# Patient Record
Sex: Male | Born: 1965 | Race: White | Hispanic: No | Marital: Married | Smoking: Never smoker
Health system: Southern US, Community
[De-identification: ages and names within clinical notes are randomized; demographics above are authoritative.]

## PROBLEM LIST (undated history)

## (undated) ENCOUNTER — Ambulatory Visit: Admission: EM | Discharge status: Absent without leave | Payer: Self-pay | Source: Home / Self Care

---

## 2020-07-23 ENCOUNTER — Other Ambulatory Visit: Payer: Self-pay

## 2020-07-23 ENCOUNTER — Ambulatory Visit
Admission: EM | Admit: 2020-07-23 | Discharge: 2020-07-23 | Disposition: A | Discharge status: Home / Self Care | Payer: BC Managed Care – PPO | Attending: Emergency Medicine | Admitting: Emergency Medicine

## 2020-07-23 ENCOUNTER — Ambulatory Visit (INDEPENDENT_AMBULATORY_CARE_PROVIDER_SITE_OTHER): Payer: BC Managed Care – PPO

## 2020-07-23 ENCOUNTER — Telehealth: Payer: Self-pay

## 2020-07-23 ENCOUNTER — Other Ambulatory Visit: Payer: Self-pay | Admitting: Infectious Diseases

## 2020-07-23 ENCOUNTER — Ambulatory Visit (HOSPITAL_COMMUNITY)
Admission: RE | Admit: 2020-07-23 | Discharge: 2020-07-23 | Disposition: A | Discharge status: Home / Self Care | Payer: BC Managed Care – PPO | Source: Ambulatory Visit | Attending: Pulmonary Disease | Admitting: Pulmonary Disease

## 2020-07-23 ENCOUNTER — Encounter: Payer: Self-pay | Admitting: Emergency Medicine

## 2020-07-23 ENCOUNTER — Telehealth: Payer: Self-pay | Admitting: Infectious Diseases

## 2020-07-23 DIAGNOSIS — U071 COVID-19: Secondary | ICD-10-CM | POA: Insufficient documentation

## 2020-07-23 DIAGNOSIS — H66003 Acute suppurative otitis media without spontaneous rupture of ear drum, bilateral: Secondary | ICD-10-CM | POA: Diagnosis present

## 2020-07-23 DIAGNOSIS — J1282 Pneumonia due to coronavirus disease 2019: Secondary | ICD-10-CM | POA: Diagnosis present

## 2020-07-23 DIAGNOSIS — R059 Cough, unspecified: Secondary | ICD-10-CM

## 2020-07-23 DIAGNOSIS — R509 Fever, unspecified: Secondary | ICD-10-CM | POA: Diagnosis not present

## 2020-07-23 LAB — RESP PANEL BY RT-PCR (FLU A&B, COVID) ARPGX2
Influenza A by PCR: NEGATIVE
Influenza B by PCR: NEGATIVE
SARS Coronavirus 2 by RT PCR: POSITIVE — AB

## 2020-07-23 MED ORDER — EPINEPHRINE 0.3 MG/0.3ML IJ SOAJ: 0.3000 mg | Freq: Once | INTRAMUSCULAR | Status: DC | PRN

## 2020-07-23 MED ORDER — AMOXICILLIN-POT CLAVULANATE 875-125 MG PO TABS: 1.0000 | ORAL_TABLET | Freq: Two times a day (BID) | ORAL | 0 refills | Status: DC

## 2020-07-23 MED ORDER — AZITHROMYCIN 250 MG PO TABS: 500.0000 mg | ORAL_TABLET | Freq: Every day | ORAL | 0 refills | Status: AC

## 2020-07-23 MED ORDER — SODIUM CHLORIDE 0.9 % IV SOLN: INTRAVENOUS | Status: DC | PRN

## 2020-07-23 MED ORDER — AZITHROMYCIN 250 MG PO TABS: 500.0000 mg | ORAL_TABLET | Freq: Every day | ORAL | 0 refills | Status: DC

## 2020-07-23 MED ORDER — ACETAMINOPHEN 325 MG PO TABS
650.0000 mg | ORAL_TABLET | Freq: Once | ORAL | Status: AC
  Administered 2020-07-23: 650 mg via ORAL
  Filled 2020-07-23: qty 2

## 2020-07-23 MED ORDER — DIPHENHYDRAMINE HCL 50 MG/ML IJ SOLN: 50.0000 mg | Freq: Once | INTRAMUSCULAR | Status: DC | PRN

## 2020-07-23 MED ORDER — METHYLPREDNISOLONE SODIUM SUCC 125 MG IJ SOLR: 125.0000 mg | Freq: Once | INTRAMUSCULAR | Status: DC | PRN

## 2020-07-23 MED ORDER — ALBUTEROL SULFATE HFA 108 (90 BASE) MCG/ACT IN AERS: 2.0000 | INHALATION_SPRAY | Freq: Once | RESPIRATORY_TRACT | Status: DC | PRN

## 2020-07-23 MED ORDER — AMOXICILLIN-POT CLAVULANATE 875-125 MG PO TABS: 1.0000 | ORAL_TABLET | Freq: Two times a day (BID) | ORAL | 0 refills | Status: AC

## 2020-07-23 MED ORDER — FAMOTIDINE IN NACL 20-0.9 MG/50ML-% IV SOLN: 20.0000 mg | Freq: Once | INTRAVENOUS | Status: DC | PRN

## 2020-07-23 MED ORDER — SOTROVIMAB 500 MG/8ML IV SOLN
500.0000 mg | Freq: Once | INTRAVENOUS | Status: AC
  Administered 2020-07-23: 500 mg via INTRAVENOUS

## 2020-07-23 NOTE — ED Provider Notes (Signed)
[facility]    CSN: [id number] Arrival date & time: 07/23/20  1044      History   Chief Complaint Chief Complaint  Patient presents with  . Cough  . Fatigue  . Generalized Body Aches    HPI Luis Merritt is a 54 y.o. male.   HPI   49-year-old male here for evaluation of cough, body aches, fatigue.  Patient reports that 2 weeks ago he was exposed to a body who stayed with him for couple days but then tested positive for Covid.  Since then he has developed the above symptoms as well as a loss of his sense of taste and smell, fever, ear pain, diarrhea, shortness of breath, and a productive cough.  Patient also had a decreased appetite.  Patient denies nausea or vomiting or sore throat.   History reviewed. No pertinent past medical history.  There are no problems to display for this patient.   History reviewed. No pertinent surgical history.     Home Medications    Prior to Admission medications   Medication Sig Start Date End Date Taking? Authorizing Provider  amoxicillin-clavulanate (AUGMENTIN) 875-125 MG tablet Take 1 tablet by mouth every 12 (twelve) hours for 10 days. 07/23/20 08/02/20  Becky Augusta, NP  azithromycin (ZITHROMAX) 250 MG tablet Take 2 tablets (500 mg total) by mouth daily for 5 days. Take first 2 tablets together, then 1 every day until finished. 07/23/20 07/28/20  Becky Augusta, NP    Family History History reviewed. No pertinent family history.  Social History Social History   Tobacco Use  . Smoking status: Never Smoker  . Smokeless tobacco: Never Used  Vaping Use  . Vaping Use: Never used  Substance Use Topics  . Alcohol use: Not Currently  . Drug use: Never     Allergies   Patient has no known allergies.   Review of Systems Review of Systems  Constitutional: Positive for appetite change, chills, fatigue and fever. Negative for activity change.  HENT: Positive for congestion, ear pain, rhinorrhea and sinus pressure.  Negative for sore throat.   Respiratory: Positive for cough and shortness of breath. Negative for wheezing.   Cardiovascular: Negative for chest pain.  Gastrointestinal: Positive for diarrhea. Negative for abdominal pain, nausea and vomiting.  Musculoskeletal: Positive for arthralgias and myalgias.  Skin: Negative for rash.  Neurological: Negative for headaches.  Hematological: Negative.   Psychiatric/Behavioral: Negative.      Physical Exam Triage Vital Signs ED Triage Vitals  Enc Vitals Group     BP 07/23/20 1057 122/77     Pulse Rate 07/23/20 1057 72     Resp 07/23/20 1057 16     Temp 07/23/20 1057 99.8 F (37.7 C)     Temp Source 07/23/20 1057 Oral     SpO2 07/23/20 1057 100 %     Weight 07/23/20 1053 250 lb (113.4 kg)     Height 07/23/20 1053 6\' 2"  (1.88 m)     Head Circumference --      Peak Flow --      Pain Score 07/23/20 1053 7     Pain Loc --      Pain Edu? --      Excl. in GC? --    No data found.  Updated Vital Signs BP 122/77 (BP Location: Right Arm)   Pulse 72   Temp 99.8 F (37.7 C) (Oral)   Resp 16   Ht 6\' 2"  (1.88 m)   Wt 250 lb (  113.4 kg)   SpO2 100%   BMI 32.10 kg/m   Visual Acuity Right Eye Distance:   Left Eye Distance:   Bilateral Distance:    Right Eye Near:   Left Eye Near:    Bilateral Near:     Physical Exam Vitals and nursing note reviewed.  Constitutional:      General: He is not in acute distress.    Appearance: Normal appearance. He is not toxic-appearing.  HENT:     Head: Normocephalic and atraumatic.     Right Ear: Ear canal and external ear normal.     Left Ear: External ear normal.     Ears:     Comments: Lateral tympanic membranes are erythematous and injected.  There is a loss of landmarks.  No effusion noted.    Nose: Rhinorrhea present. No congestion.     Comments: Nasal mucosa is mildly erythematous without edema.  There is a mixture of yellow and bloody discharge on the walls of the septum bilaterally.  Patient  has no tenderness to his sinuses to percussion.    Mouth/Throat:     Mouth: Mucous membranes are moist.     Pharynx: Oropharynx is clear. Posterior oropharyngeal erythema present. No oropharyngeal exudate.     Comments: Patient is some clear postnasal drip and posterior oropharyngeal erythema with injection. Eyes:     General: No scleral icterus.    Extraocular Movements: Extraocular movements intact.     Conjunctiva/sclera: Conjunctivae normal.     Pupils: Pupils are equal, round, and reactive to light.  Cardiovascular:     Rate and Rhythm: Normal rate and regular rhythm.     Pulses: Normal pulses.     Heart sounds: Normal heart sounds. No murmur heard.  No gallop.   Pulmonary:     Effort: Pulmonary effort is normal. No respiratory distress.     Comments: Patient has decreased lung sounds in all fields. Musculoskeletal:        General: No swelling or tenderness. Normal range of motion.     Cervical back: Normal range of motion and neck supple.  Lymphadenopathy:     Cervical: No cervical adenopathy.  Skin:    General: Skin is warm and dry.     Capillary Refill: Capillary refill takes less than 2 seconds.     Findings: No erythema or rash.  Neurological:     General: No focal deficit present.     Mental Status: He is alert and oriented to person, place, and time.  Psychiatric:        Mood and Affect: Mood normal.        Behavior: Behavior normal.        Thought Content: Thought content normal.        Judgment: Judgment normal.      UC Treatments / Results  Labs (all labs ordered are listed, but only abnormal results are displayed) Labs Reviewed  RESP PANEL BY RT-PCR (FLU A&B, COVID) ARPGX2 - Abnormal; Notable for the following components:      Result Value   SARS Coronavirus 2 by RT PCR POSITIVE (*)    All other components within normal limits    EKG   Radiology DG Chest 2 View  Result Date: 07/23/2020 CLINICAL DATA:  Fever and cough.  COVID-19 exposure EXAM:  CHEST - 2 VIEW COMPARISON:  None. FINDINGS: The heart, hila, and mediastinum are unremarkable. Increased density in the region of the first costochondral junction is likely asymmetric degenerative change. Suggested  very subtle hazy opacity in the periphery of the right mid and lower lung. Streaky opacity in either the right middle lobe or lingula based on the lateral view. The lungs are otherwise clear. No pneumothorax. No other acute abnormalities. IMPRESSION: 1. Suggested subtle hazy ground-glass opacity in the periphery of the lungs bilaterally as above may represent early developing multifocal infiltrate such as COVID-19 pneumonia given the appearance. 2. Nonspecific streaky opacity in the right middle lobe or lingula based on the lateral view could represent developing infiltrate or atelectasis. Electronically Signed   By: Gerome Sam III M.D   On: 07/23/2020 12:28    Procedures Procedures (including critical care time)  Medications Ordered in UC Medications - No data to display  Initial Impression / Assessment and Plan / UC Course  I have reviewed the triage vital signs and the nursing notes.  Pertinent labs & imaging results that were available during my care of the patient were reviewed by me and considered in my medical decision making (see chart for details).   Patient have Covid symptoms that has had for the past 9 days.  Patient was exposed to Covid 2 weeks ago when a body of his stated the house for 2 days and then later tested positive for Covid.  Patient has had a fever, loss of taste and smell, chills, fatigue, and shortness of breath.  Patient tympanic membrane's are erythematous and injected bilaterally.  There is inflammation of his nasal passages with purulent discharge on the walls of the septum.  No sinus tenderness to percussion.  Lung sounds are decreased in all fields.  Patient has not been vaccinated against the flu or Covid 19.  Patient reports until yesterday he was  following the I-MASK protocol and taking ivermectin but he is since stopped.  Will check triplex as I suspect patient has Covid and also obtain chest x-ray.   Final Clinical Impressions(s) / UC Diagnoses   Final diagnoses:  COVID-19  Pneumonia due to COVID-19 virus  Non-recurrent acute suppurative otitis media of both ears without spontaneous rupture of tympanic membranes     Discharge Instructions     To the infusion clinic for a 330 appointment to begin your antibiotic infusions.  Take the Augmentin twice daily for 10 days.  Take the azithromycin 500 mg daily for 5 days.  If you develop increasing shortness of breath, especially at rest, you cannot speak a full sentence, or you need follow-up bluing of your lip she did go to the ER for evaluation.  You need to quarantine until your symptoms have improved and you have not had a fever for 24 hours without the use of Tylenol or ibuprofen.    ED Prescriptions    Medication Sig Dispense Auth. Provider   amoxicillin-clavulanate (AUGMENTIN) 875-125 MG tablet Take 1 tablet by mouth every 12 (twelve) hours for 10 days. 20 tablet Becky Augusta, NP   azithromycin (ZITHROMAX) 250 MG tablet Take 2 tablets (500 mg total) by mouth daily for 5 days. Take first 2 tablets together, then 1 every day until finished. 10 tablet Becky Augusta, NP     PDMP not reviewed this encounter.   Becky Augusta, NP 07/23/20 1319

## 2020-07-23 NOTE — Telephone Encounter (Signed)
Called to Discuss with patient about Covid symptoms and the use of the monoclonal antibody infusion for those with mild to moderate Covid symptoms and at a high risk of hospitalization.     Pt appears to qualify for this infusion due to co-morbid conditions and/or a member of an at-risk group in accordance with the FDA Emergency Use Authorization.    Sx day 9 now and COVID PNA without hypoxia indicating moderate illness. Qualifies or infusion with BMI 31.   Information including cost discussed. He has directions to get to 3:30 appt today    Rexene Alberts, MSN, NP-C [facility]  [email or web address] Pager: [phone] Office: [phone] [facility] Main Line: [phone]

## 2020-07-23 NOTE — Progress Notes (Signed)
Patient reviewed Fact Sheet for Patients, Parents, and Caregivers for Emergency Use Authorization (EUA) of Sotrovimab for the Treatment of Coronavirus. Patient also reviewed and is agreeable to the estimated cost of treatment. Patient is agreeable to proceed.   

## 2020-07-23 NOTE — Discharge Instructions (Addendum)
To the infusion clinic for a 330 appointment to begin your antibiotic infusions.  Take the Augmentin twice daily for 10 days.  Take the azithromycin 500 mg daily for 5 days.  If you develop increasing shortness of breath, especially at rest, you cannot speak a full sentence, or you need follow-up bluing of your lip she did go to the ER for evaluation.  You need to quarantine until your symptoms have improved and you have not had a fever for 24 hours without the use of Tylenol or ibuprofen.

## 2020-07-23 NOTE — Discharge Instructions (Signed)

## 2020-07-23 NOTE — ED Triage Notes (Addendum)
Patient c/o cough, congestion, fatigue, bodyaches and chills that started a week ago.  Patient denies fevers.   Patient states that he was exposed to a family member 2 weeks ago that tested positive for COVID.  Patient states that he does not want a COVID test because he knows he already has COVID.

## 2020-07-23 NOTE — Progress Notes (Addendum)
  Diagnosis: COVID-19  Physician:Dr. Delford Field  Procedure: Sotorvimab  Complications: No immediate complications noted.  Discharge: Discharged home   Luis Merritt 07/23/2020

## 2020-07-23 NOTE — Progress Notes (Signed)
I connected by phone with Luis Merritt on 07/23/2020 at 1:42 PM to discuss the potential use of a new treatment for mild to moderate COVID-19 viral infection in non-hospitalized patients.  This patient is a 54 y.o. male that meets the FDA criteria for Emergency Use Authorization of COVID monoclonal antibody casirivimab/imdevimab, bamlanivimab/eteseviamb, or sotrovimab.  Has a (+) direct SARS-CoV-2 viral test result  Has mild or moderate COVID-19   Is NOT hospitalized due to COVID-19  Is within 10 days of symptom onset  Has at least one of the high risk factor(s) for progression to severe COVID-19 and/or hospitalization as defined in EUA.  Specific high risk criteria : BMI > 25   I have spoken and communicated the following to the patient or parent/caregiver regarding COVID monoclonal antibody treatment:  1. FDA has authorized the emergency use for the treatment of mild to moderate COVID-19 in adults and pediatric patients with positive results of direct SARS-CoV-2 viral testing who are 57 years of age and older weighing at least 40 kg, and who are at high risk for progressing to severe COVID-19 and/or hospitalization.  2. The significant known and potential risks and benefits of COVID monoclonal antibody, and the extent to which such potential risks and benefits are unknown.  3. Information on available alternative treatments and the risks and benefits of those alternatives, including clinical trials.  4. Patients treated with COVID monoclonal antibody should continue to self-isolate and use infection control measures (e.g., wear mask, isolate, social distance, avoid sharing personal items, clean and disinfect "high touch" surfaces, and frequent handwashing) according to CDC guidelines.   5. The patient or parent/caregiver has the option to accept or refuse COVID monoclonal antibody treatment.  After reviewing this information with the patient, the patient has agreed to receive one of  the available covid 19 monoclonal antibodies and will be provided an appropriate fact sheet prior to infusion. Rexene Alberts, NP 07/23/2020 1:42 PM

## 2021-11-09 IMAGING — CR DG CHEST 2V
2 series · 3 of 3 positions shown · non-contrast
Comparison: None.

CLINICAL DATA: Fever and cough.  5P9SN-2R exposure

EXAM:
CHEST - 2 VIEW

[chest pa]
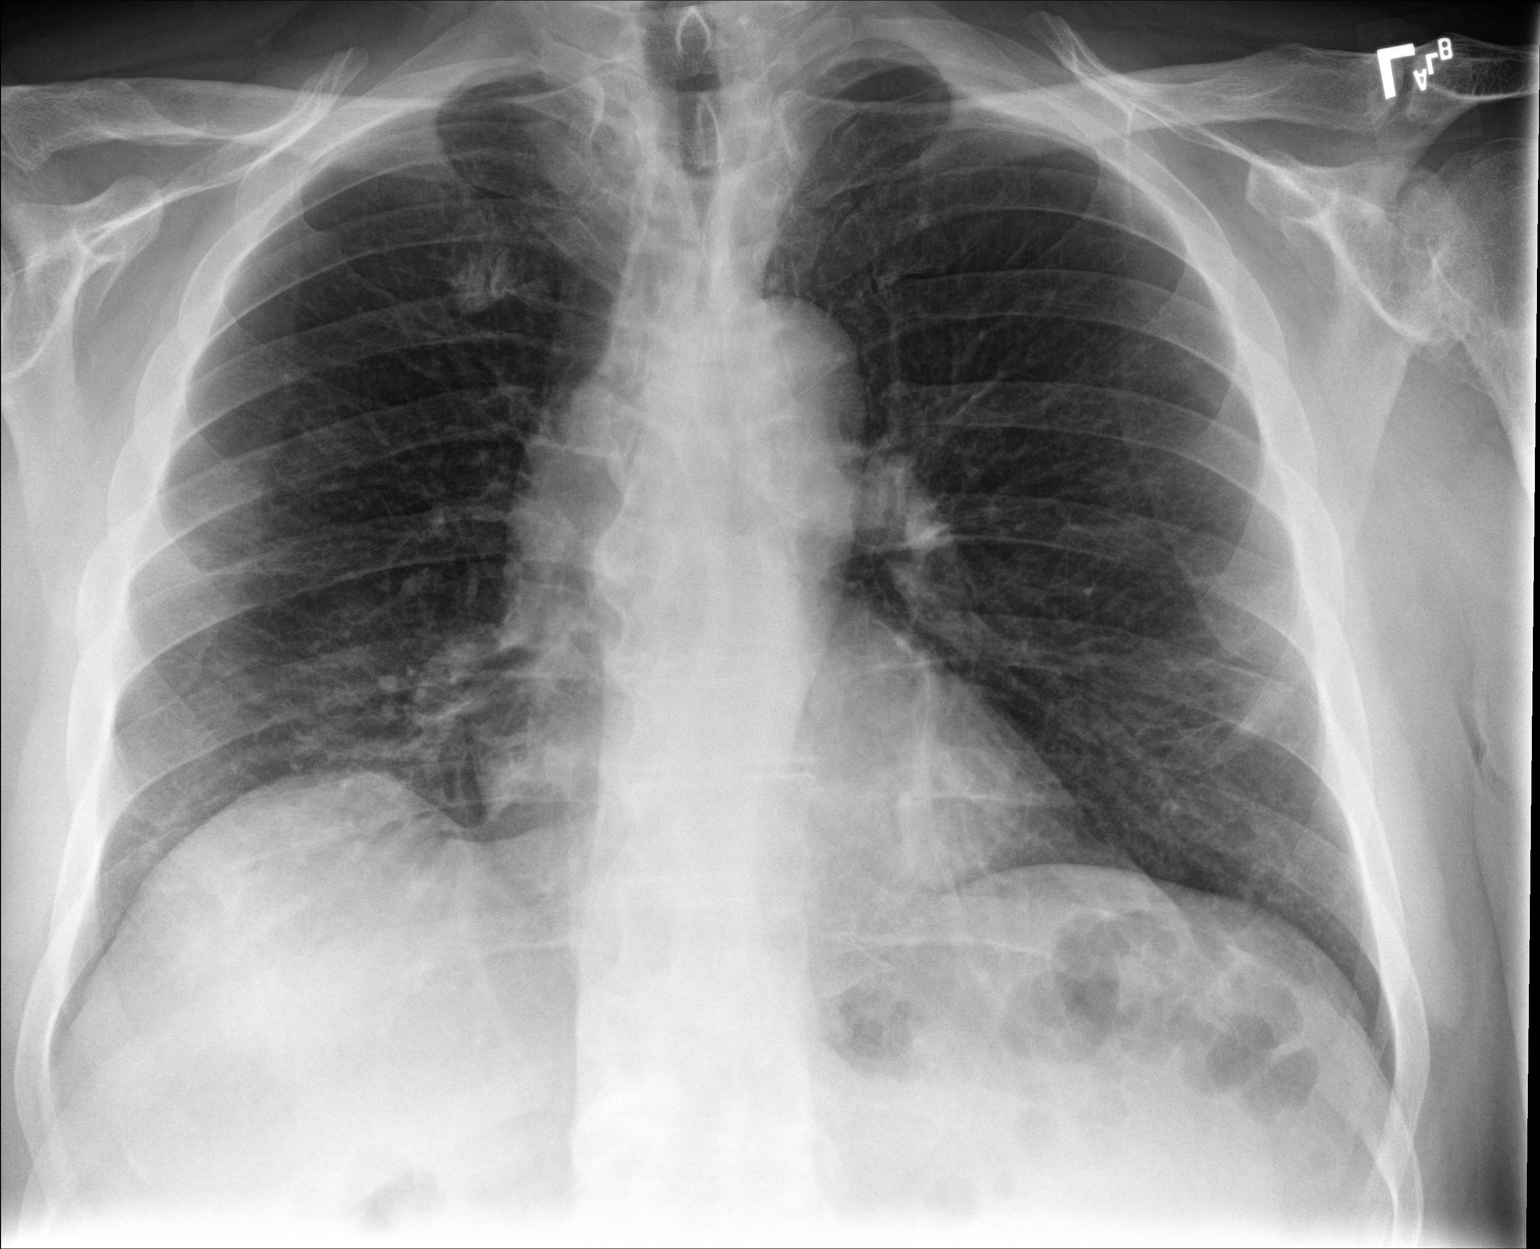

[Series 2: chest lat · 0.14mm/px · 2 of 2 slices shown]
[im 1/2]
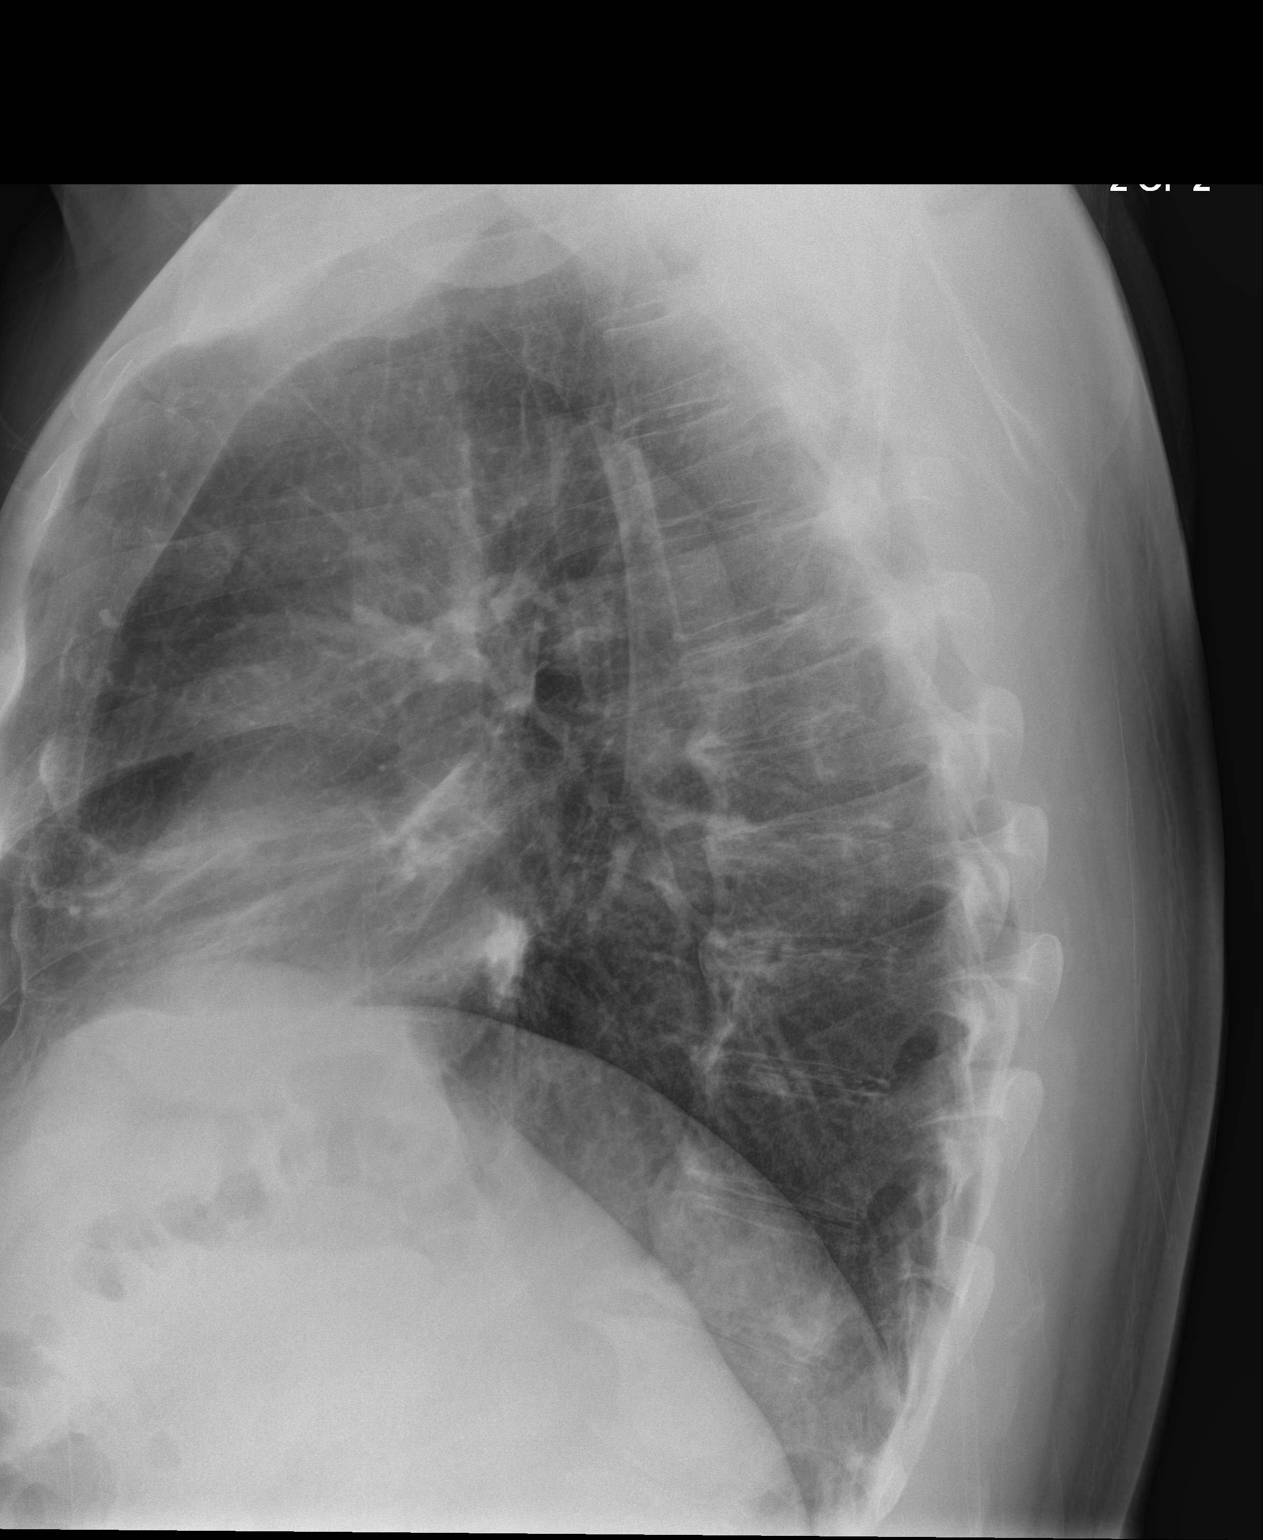
[im 2/2]
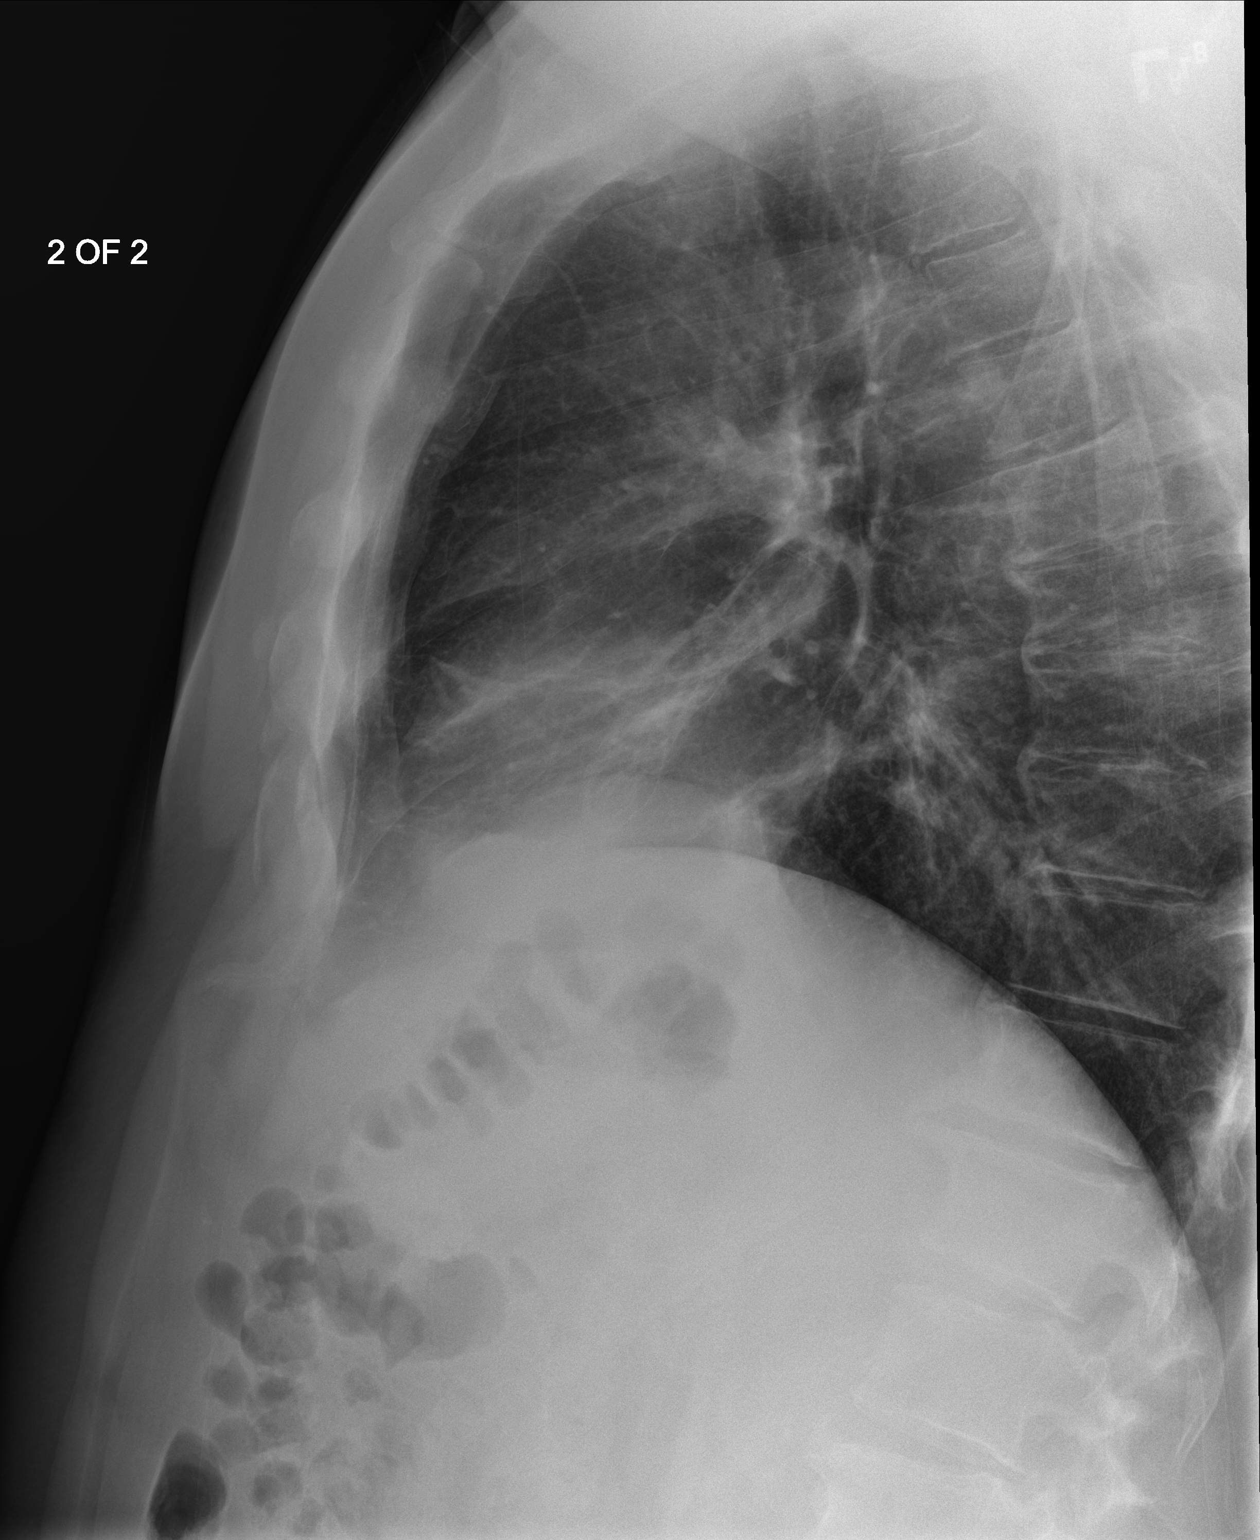

[3 of 3 positions shown; findings below may reference images not displayed]

FINDINGS: The heart, hila, and mediastinum are unremarkable. Increased density
in the region of the first costochondral junction is likely
asymmetric degenerative change. Suggested very subtle hazy opacity
in the periphery of the right mid and lower lung. Streaky opacity in
either the right middle lobe or lingula based on the lateral view.
The lungs are otherwise clear. No pneumothorax. No other acute
abnormalities.
IMPRESSION: 1. Suggested subtle hazy ground-glass opacity in the periphery of
the lungs bilaterally as above may represent early developing
multifocal infiltrate such as 5P9SN-2R pneumonia given the
appearance.
2. Nonspecific streaky opacity in the right middle lobe or lingula
based on the lateral view could represent developing infiltrate or
atelectasis.

## 2022-01-09 ENCOUNTER — Other Ambulatory Visit: Payer: Self-pay | Admitting: Family Medicine

## 2022-01-09 DIAGNOSIS — R7989 Other specified abnormal findings of blood chemistry: Secondary | ICD-10-CM

## 2022-01-16 ENCOUNTER — Ambulatory Visit
Admission: RE | Admit: 2022-01-16 | Discharge: 2022-01-16 | Disposition: A | Discharge status: Home / Self Care | Payer: BC Managed Care – PPO | Source: Ambulatory Visit | Attending: Family Medicine | Admitting: Family Medicine

## 2022-01-16 DIAGNOSIS — R7989 Other specified abnormal findings of blood chemistry: Secondary | ICD-10-CM | POA: Insufficient documentation

## 2023-05-05 IMAGING — US US RENAL ARTERY STENOSIS
1 series · 14 of 25 positions shown · non-contrast
Comparison: None Available.

CLINICAL DATA: 56-year-old male with elevated serum creatinine

EXAM:
RENAL/URINARY TRACT ULTRASOUND
RENAL DUPLEX DOPPLER ULTRASOUND

[Series 1: us renal artery duplex complete · 14 of 64 slices shown]
[im 1/64]
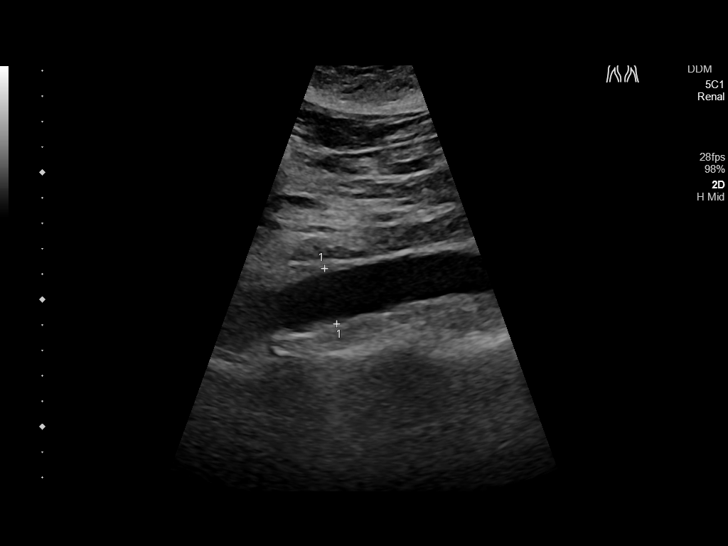
[im 6/64]
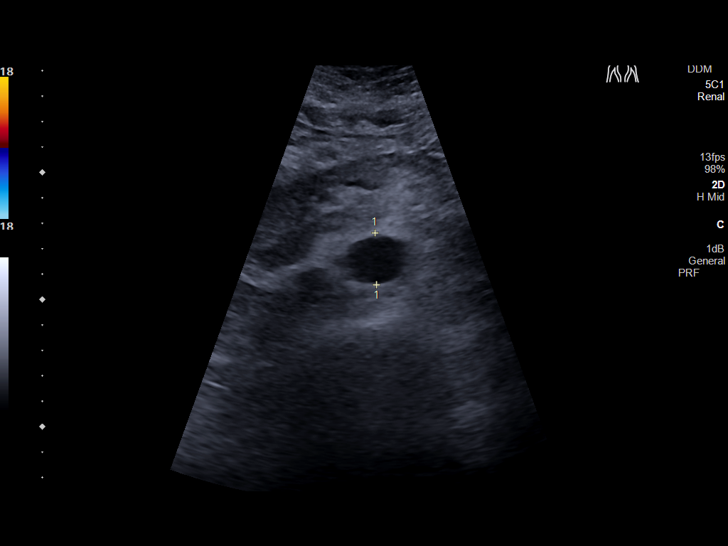
[im 11/64]
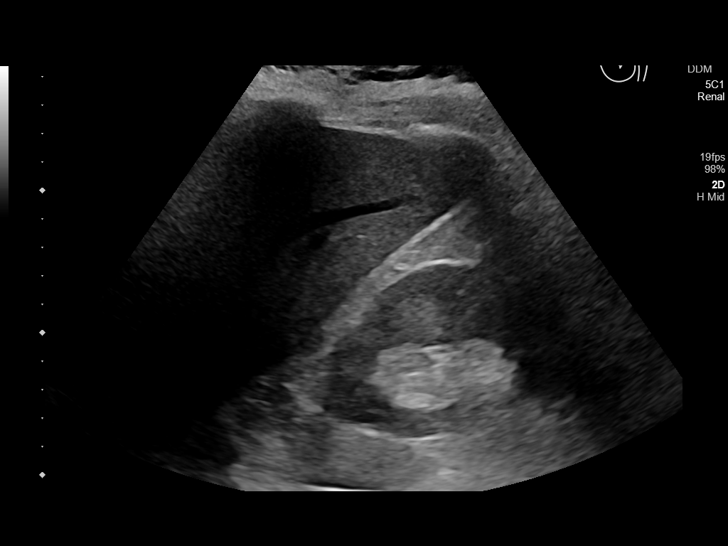
[im 16/64]
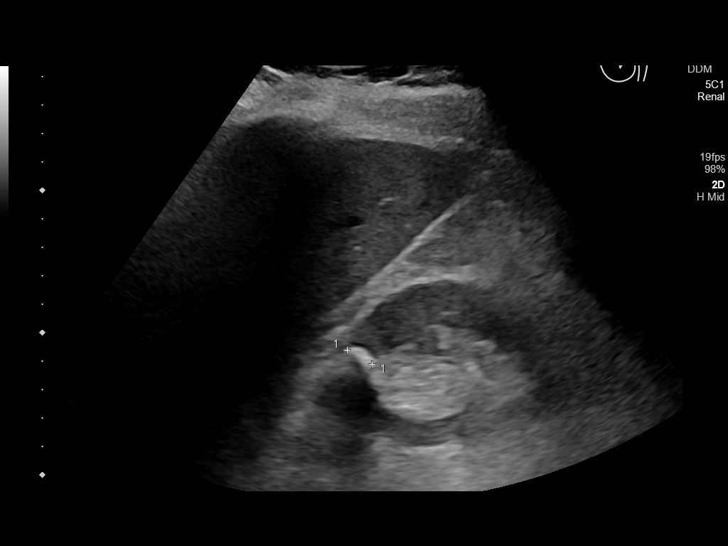
[im 22/64]
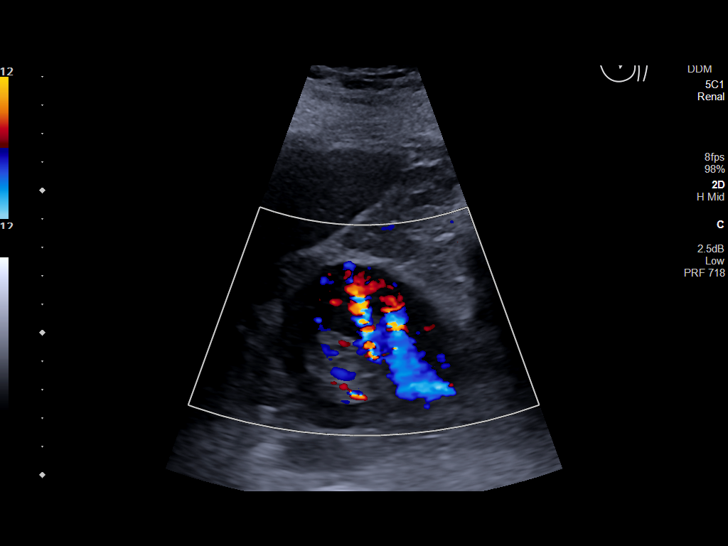
[im 24/64]
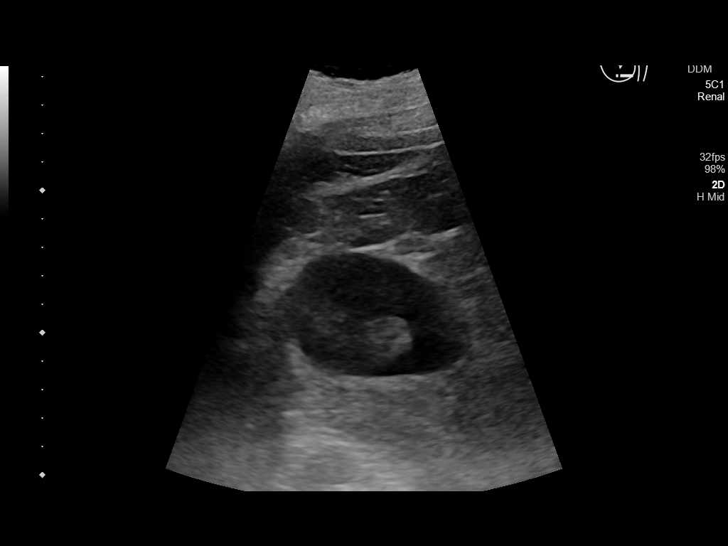
[im 29/64]
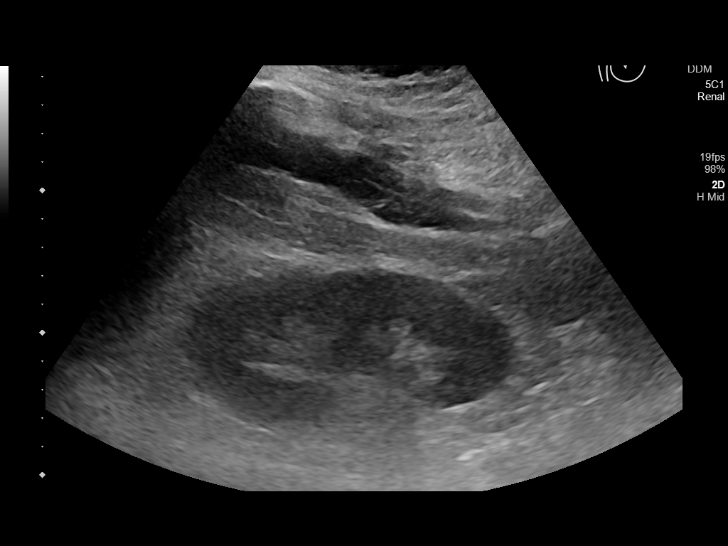
[im 35/64]
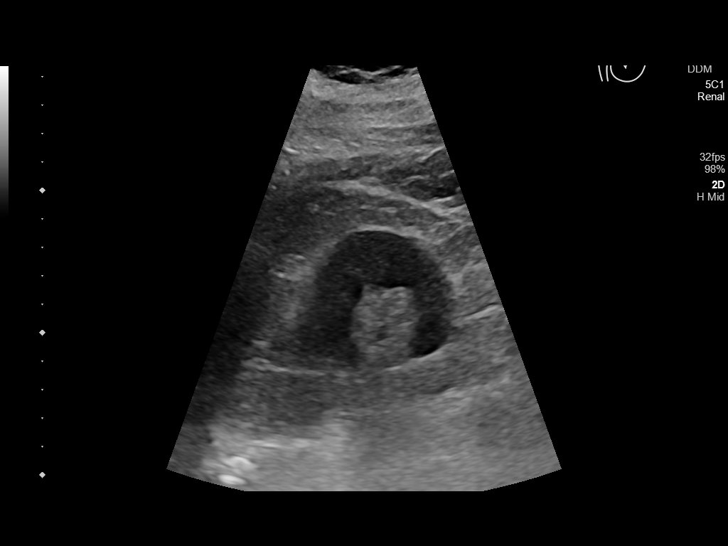
[im 40/64]
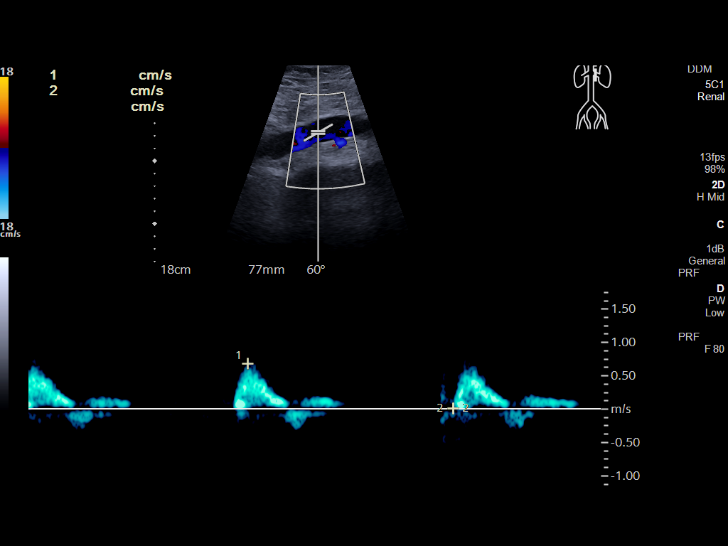
[im 43/64]
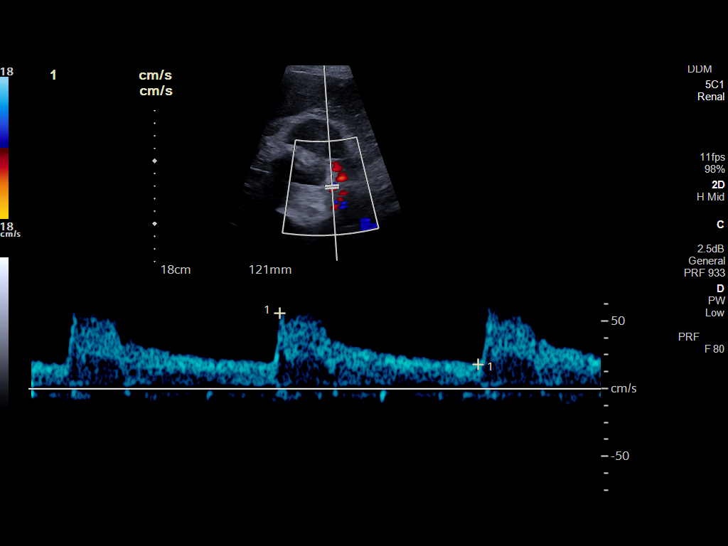
[im 48/64]
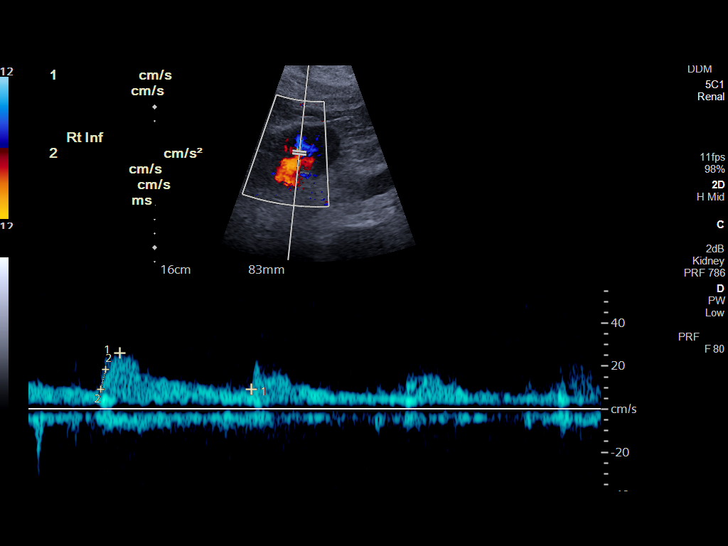
[im 53/64]
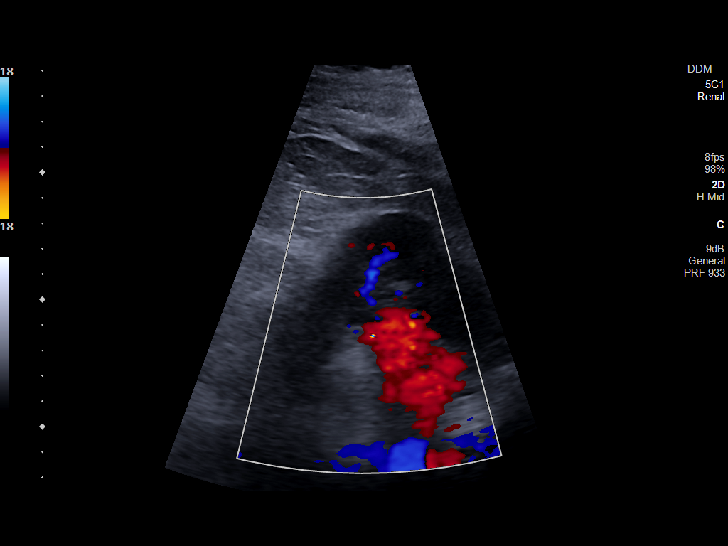
[im 58/64]
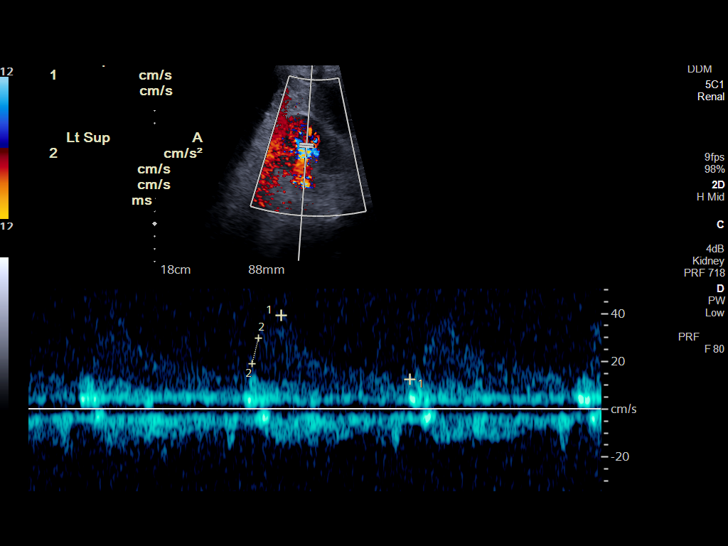
[im 64/64]
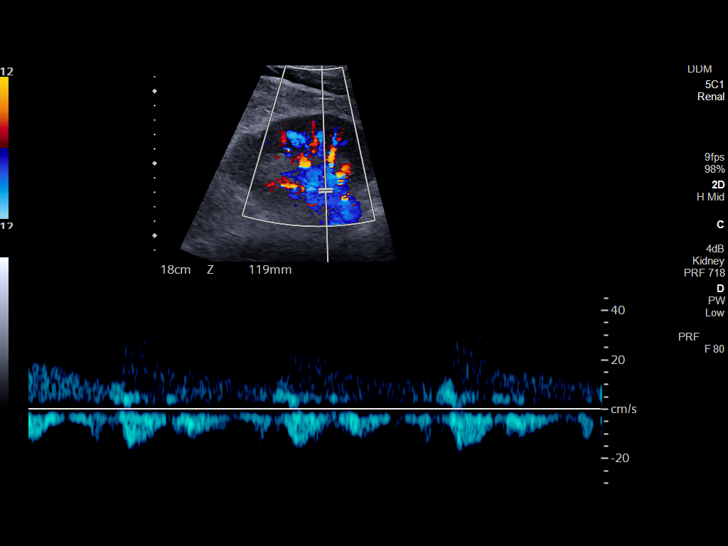

[14 of 25 positions shown; findings below may reference images not displayed]

FINDINGS: Right Kidney:

Length: 10.2 cm. Echogenic focus in the upper pole collecting system
of the right kidney measuring 1 cm with posterior shadowing
compatible with calculus. No hydronephrosis. Unremarkable cortex
echogenicity.

Left Kidney:

Length: 11.6 cm. Echogenicity within normal limits. No mass or
hydronephrosis visualized.

Bladder:  Unremarkable

RENAL DUPLEX ULTRASOUND

Right Renal Artery Velocities:

Origin:  49 cm/sec

Mid:  56 cm/sec

Hilum:  63 cm/sec

Interlobar:  57 cm/sec

Arcuate:  29 cm/sec

Left Renal Artery Velocities:

Origin:  63 cm/sec

Mid:  98 cm/sec

Hilum:  108 cm/sec

Interlobar:  43 cm/sec

Arcuate:  19 cm/sec

Aortic Velocity:  68 cm/sec

Right Renal-Aortic Ratios:

Origin:

Mid:

Hilum:

Interlobar:

Arcuate:

Left Renal-Aortic Ratios:

Origin:

Mid:

Hilum:

Interlobar:

Arcuate:
IMPRESSION: Directed duplex of the renal arteries negative for high-grade
stenosis.

Nonobstructing right renal calculus
